# Patient Record
Sex: Male | Born: 2000 | Race: White | Hispanic: No | Marital: Single | Smoking: Never smoker
Health system: Southern US, Community
[De-identification: ages and names within clinical notes are randomized; demographics above are authoritative.]

## PROBLEM LIST (undated history)

## (undated) DIAGNOSIS — E11649 Type 2 diabetes mellitus with hypoglycemia without coma: Secondary | ICD-10-CM

## (undated) DIAGNOSIS — E10649 Type 1 diabetes mellitus with hypoglycemia without coma: Secondary | ICD-10-CM

## (undated) DIAGNOSIS — R6250 Unspecified lack of expected normal physiological development in childhood: Secondary | ICD-10-CM

## (undated) DIAGNOSIS — E049 Nontoxic goiter, unspecified: Secondary | ICD-10-CM

## (undated) DIAGNOSIS — R569 Unspecified convulsions: Secondary | ICD-10-CM

## (undated) DIAGNOSIS — E109 Type 1 diabetes mellitus without complications: Secondary | ICD-10-CM

## (undated) HISTORY — DX: Type 1 diabetes mellitus without complications: E10.9

## (undated) HISTORY — DX: Unspecified lack of expected normal physiological development in childhood: R62.50

## (undated) HISTORY — DX: Nontoxic goiter, unspecified: E04.9

## (undated) HISTORY — DX: Unspecified convulsions: R56.9

## (undated) HISTORY — DX: Type 2 diabetes mellitus with hypoglycemia without coma: E11.649

## (undated) HISTORY — DX: Type 1 diabetes mellitus with hypoglycemia without coma: E10.649

## (undated) HISTORY — PX: TYMPANOSTOMY TUBE PLACEMENT: SHX32

---
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
## 2020-03-01 IMAGING — CR RIGHT SHOULDER - 2+ VIEW
2 series · 2 of 2 positions shown · non-contrast
Comparison: None.

CLINICAL DATA: Recent injury

EXAM:
RIGHT SHOULDER - 2+ VIEW

[w shoulder external right]
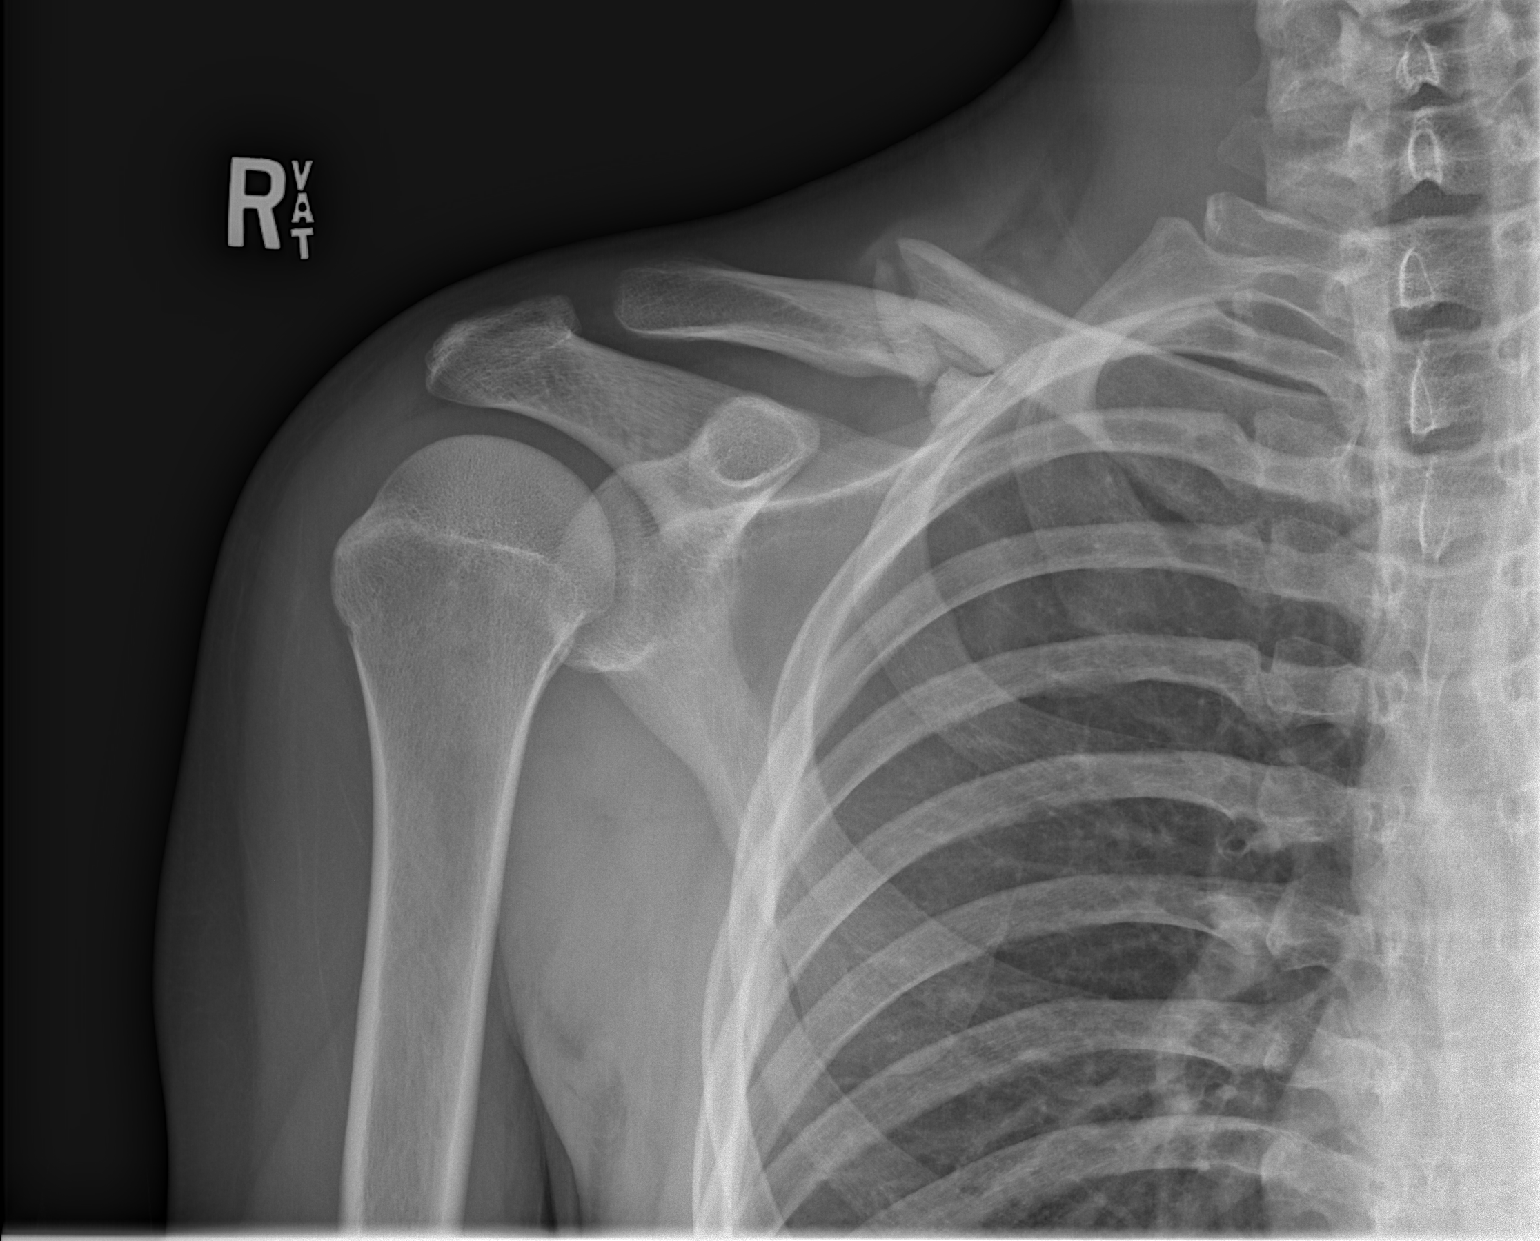

[w shoulder y-view right]
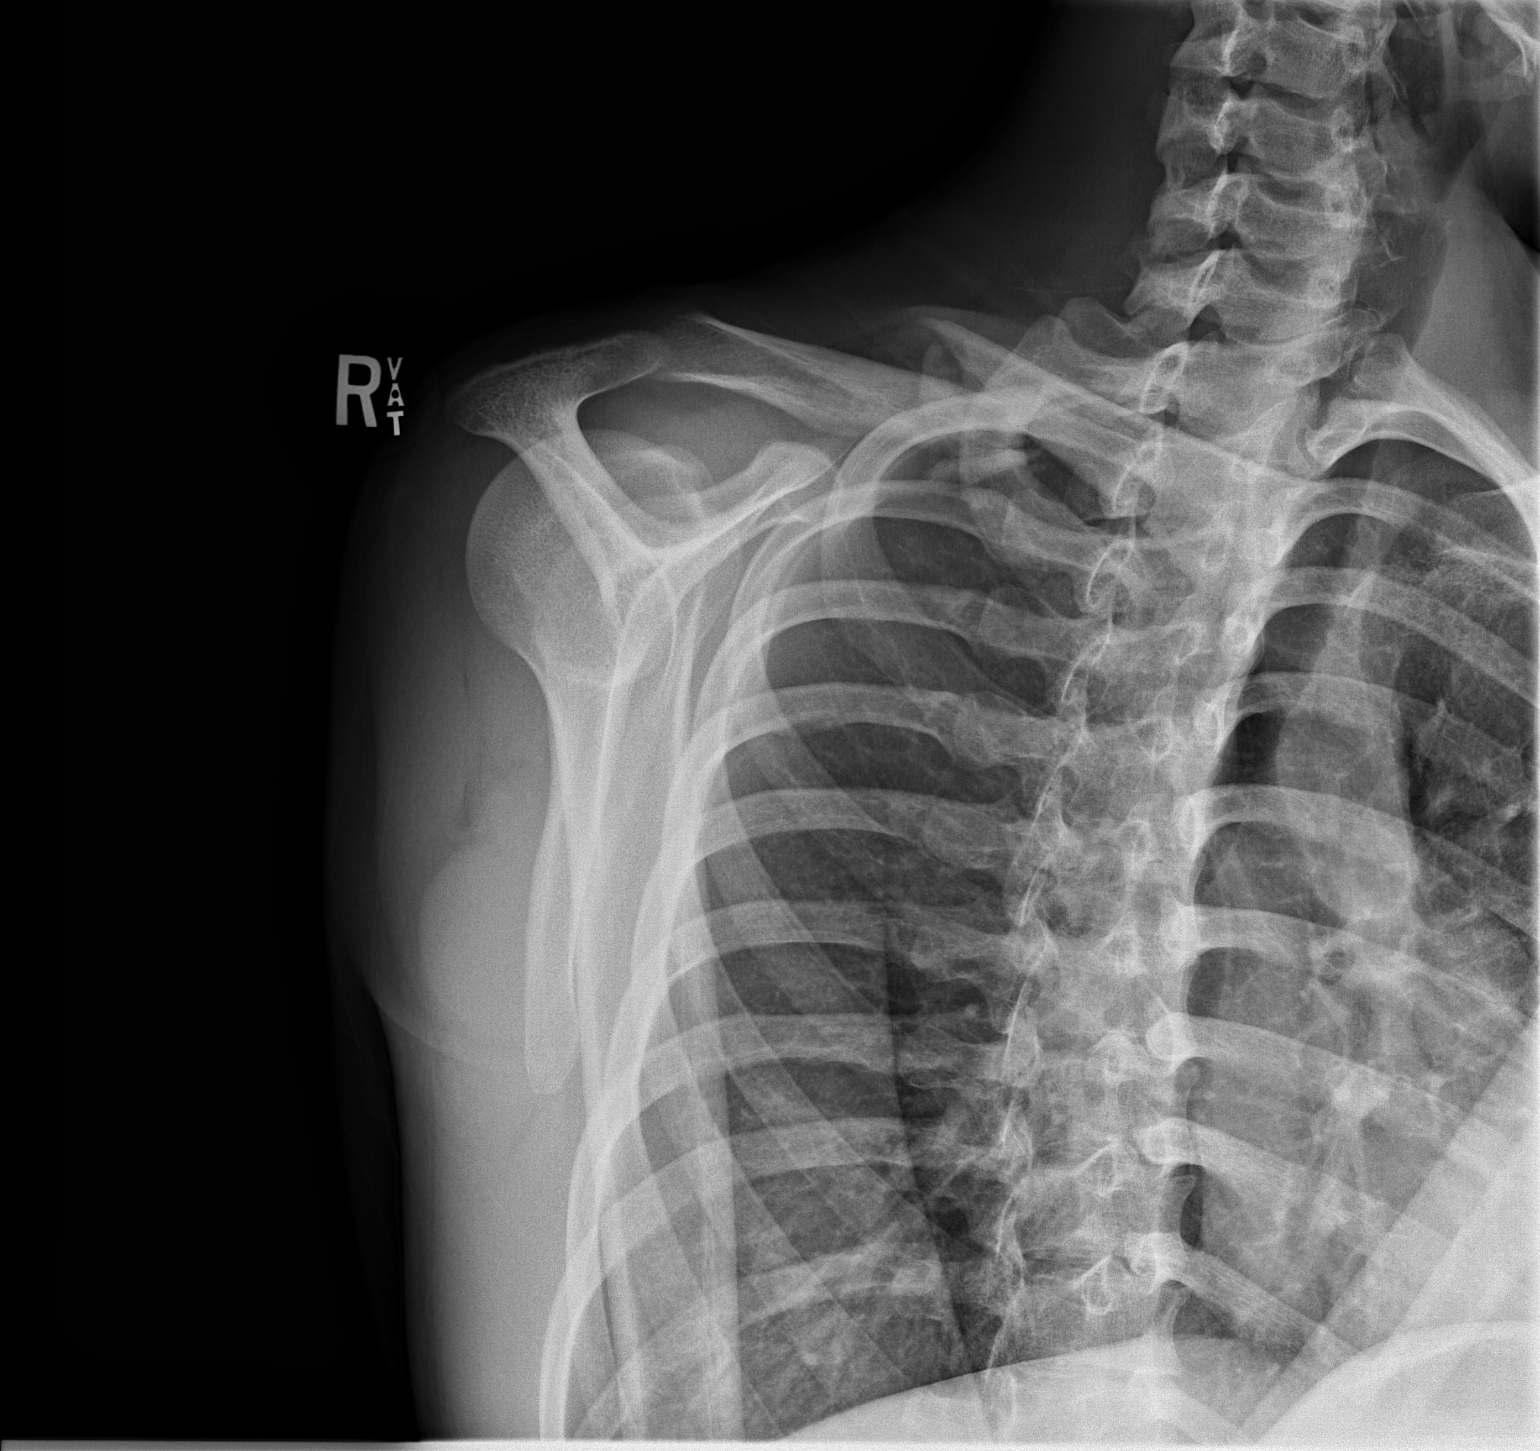

[2 of 2 positions shown; findings below may reference images not displayed]

FINDINGS: Comminuted fracture mid clavicle with 1 shaft width of inferior
displacement and mild over riding. Normal shoulder joint. No other
fracture
IMPRESSION: Comminuted displaced fracture mid clavicle.
